# Patient Record
Sex: Female | Born: 1995 | Race: White | Hispanic: No | Marital: Single | Smoking: Never smoker
Health system: Southern US, Community
[De-identification: ages and names within clinical notes are randomized; demographics above are authoritative.]

## PROBLEM LIST (undated history)

## (undated) DIAGNOSIS — F909 Attention-deficit hyperactivity disorder, unspecified type: Secondary | ICD-10-CM

## (undated) DIAGNOSIS — N390 Urinary tract infection, site not specified: Secondary | ICD-10-CM

## (undated) HISTORY — DX: Urinary tract infection, site not specified: N39.0

## (undated) HISTORY — DX: Attention-deficit hyperactivity disorder, unspecified type: F90.9

---
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
## 2014-04-29 ENCOUNTER — Ambulatory Visit: Payer: Self-pay | Admitting: Physician Assistant

## 2014-04-29 LAB — URINALYSIS, COMPLETE
BILIRUBIN, UR: NEGATIVE
Blood: NEGATIVE
Glucose,UR: NEGATIVE
LEUKOCYTE ESTERASE: NEGATIVE
Nitrite: NEGATIVE
Ph: 8.5 (ref 5.0–8.0)
Protein: 100
Specific Gravity: 1.015 (ref 1.000–1.030)

## 2014-04-29 LAB — CBC WITH DIFFERENTIAL/PLATELET
Basophil #: 0 10*3/uL (ref 0.0–0.1)
Basophil %: 0.1 %
EOS ABS: 0.1 10*3/uL (ref 0.0–0.7)
Eosinophil %: 0.7 %
HCT: 39.8 % (ref 35.0–47.0)
HGB: 13.1 g/dL (ref 12.0–16.0)
LYMPHS PCT: 10.1 %
Lymphocyte #: 1.2 10*3/uL (ref 1.0–3.6)
MCH: 29 pg (ref 26.0–34.0)
MCHC: 32.9 g/dL (ref 32.0–36.0)
MCV: 88 fL (ref 80–100)
MONO ABS: 0.7 x10 3/mm (ref 0.2–0.9)
Monocyte %: 5.7 %
Neutrophil #: 9.7 10*3/uL — ABNORMAL HIGH (ref 1.4–6.5)
Neutrophil %: 83.4 %
Platelet: 248 10*3/uL (ref 150–440)
RBC: 4.52 10*6/uL (ref 3.80–5.20)
RDW: 12.4 % (ref 11.5–14.5)
WBC: 11.7 10*3/uL — ABNORMAL HIGH (ref 3.6–11.0)

## 2014-04-29 LAB — CLOSTRIDIUM DIFFICILE(ARMC)

## 2014-04-29 LAB — COMPREHENSIVE METABOLIC PANEL
ALK PHOS: 70 U/L
Albumin: 4.6 g/dL (ref 3.8–5.6)
Anion Gap: 15 (ref 7–16)
BILIRUBIN TOTAL: 1.2 mg/dL — AB (ref 0.2–1.0)
BUN: 9 mg/dL (ref 7–18)
CO2: 24 mmol/L (ref 21–32)
CREATININE: 0.9 mg/dL (ref 0.60–1.30)
Calcium, Total: 9.7 mg/dL (ref 9.0–10.7)
Chloride: 96 mmol/L — ABNORMAL LOW (ref 98–107)
EGFR (African American): >60
Glucose: 116 mg/dL — ABNORMAL HIGH (ref 65–99)
OSMOLALITY: 270 (ref 275–301)
Potassium: 3.3 mmol/L — ABNORMAL LOW (ref 3.5–5.1)
SGOT(AST): 29 U/L — ABNORMAL HIGH (ref 0–26)
SGPT (ALT): 38 U/L
SODIUM: 135 mmol/L — AB (ref 136–145)
Total Protein: 7.6 g/dL (ref 6.4–8.6)

## 2014-05-02 LAB — STOOL CULTURE

## 2014-06-04 ENCOUNTER — Ambulatory Visit (INDEPENDENT_AMBULATORY_CARE_PROVIDER_SITE_OTHER): Payer: BLUE CROSS/BLUE SHIELD | Admitting: Internal Medicine

## 2014-06-04 ENCOUNTER — Encounter (INDEPENDENT_AMBULATORY_CARE_PROVIDER_SITE_OTHER): Payer: Self-pay

## 2014-06-04 ENCOUNTER — Encounter: Payer: Self-pay | Admitting: Internal Medicine

## 2014-06-04 VITALS — BP 100/60 | HR 89 | Temp 98.2°F | Resp 14 | Ht 66.0 in | Wt <= 1120 oz

## 2014-06-04 DIAGNOSIS — Z30011 Encounter for initial prescription of contraceptive pills: Secondary | ICD-10-CM

## 2014-06-04 DIAGNOSIS — F900 Attention-deficit hyperactivity disorder, predominantly inattentive type: Secondary | ICD-10-CM

## 2014-06-04 DIAGNOSIS — Z111 Encounter for screening for respiratory tuberculosis: Secondary | ICD-10-CM

## 2014-06-04 DIAGNOSIS — Z87898 Personal history of other specified conditions: Secondary | ICD-10-CM

## 2014-06-04 DIAGNOSIS — F988 Other specified behavioral and emotional disorders with onset usually occurring in childhood and adolescence: Secondary | ICD-10-CM

## 2014-06-04 LAB — POCT URINE PREGNANCY: PREG TEST UR: NEGATIVE

## 2014-06-04 MED ORDER — NORETHINDRONE ACET-ETHINYL EST 1-20 MG-MCG PO TABS: 1.0000 | ORAL_TABLET | Freq: Every day | ORAL | Status: AC

## 2014-06-04 NOTE — Progress Notes (Signed)
Patient ID: Gloria Jensen, female   DOB: 07/30/1995, 19 y.o.   MRN: [id number]  Patient Active Problem List   Diagnosis Date Noted  . Attention deficit disorder predominant inattentive type 06/06/2014  . Contraceptive management 06/06/2014    Subjective:  CC:   Chief Complaint  Patient presents with  . Establish Care    HPI:   Gloria Jensen a 19 y.o. female who presents for establishment of care.  New patient.  History of ADD  Diagnosed by Dr. Maryruth BunKapur after referral by her pediatrician last summer.  Her pediatrician has been prescribing medication  She is Studying nursing at [employer] .  Just started the [employer] program.  as a high school student did cheerleading  And competetive dance,  Some broken toes ,  Nothing major.     Spends 4 hours in class daily,  Has about 2 hours per night of homework .  Workload varies.  Not having any trouble finishing tasks since she started using stimulants.  Diagnosed with ADD last year after graduation from [employer],  Had difficulty in school but was not checked out until seen by Dr Maryruth BunKapur.Marland Kitchen.  Has been taking it for a year and performance is improving.  Sleepimg well. Weight stable .  Has a steady, Boyfriend,  Not on birth control, but using barrier protection with condoms. . Wants to resume oral contraception.   Receved Gardasil from her pediatrician  Prior to onset of sexual activity.   History of an episode of  right sided CVA pain accompanied by nausea and vomiting in August 2015 .  [facility] Tre[facility] at [facility] . Evaluation included CT scan which was negative for stones appendix not visutalized,  CBC UA and CMET were normal.  Has recurrence of right sided pain from time to time, never as severe as prior episode.  More recent ER evaluation for diarrhea,  All cultures were negative.      Past Medical History  Diagnosis Date  . UTI (lower urinary tract infection)     couple in the past  . ADHD (attention deficit hyperactivity disorder) 2015    Allergies  Allergen Reactions  . Penicillins Rash    History reviewed. No pertinent past surgical history.  History   Social History  . Marital Status: Single    Spouse Name: N/A  . Number of Children: N/A  . Years of Education: N/A   Occupational History  . Not on file.   Social History Main Topics  . Smoking status: Never Smoker   . Smokeless tobacco: Never Used  . Alcohol Use: Yes     Comment: some occasional  . Drug Use: No  . Sexual Activity: Yes   Other Topics Concern  . Not on file   Social History Narrative  . No narrative on file   Family History  Problem Relation Age of Onset  . Hypertension Mother   . Diabetes Maternal Grandmother      Review of Systems: Patient denies headache, fevers, malaise, unintentional weight loss, skin rash, eye pain, sinus congestion and sinus pain, sore throat, dysphagia,  hemoptysis , cough, dyspnea, wheezing, chest pain, palpitations, orthopnea, edema, abdominal pain, nausea, melena, diarrhea, constipation, flank pain, dysuria, hematuria, urinary  Frequency, nocturia, numbness, tingling, seizures,  Focal weakness, Loss of consciousness,  Tremor, insomnia, depression, anxiety, and suicidal ideation.     Objective:  BP 100/60 mmHg  Pulse 89  Temp(Src) 98.2 F (36.8 C) (Oral)  Resp 14  Ht 5\' 6"  (1.676 m)  Wt 12 lb (5.443 kg)  BMI 1.94 kg/m2  SpO2 100%  LMP 05/10/2014 (Approximate)  General appearance: alert, cooperative and appears stated age Ears: normal TM's and external ear canals both ears Throat: lips, mucosa, and tongue normal; teeth and gums normal Neck: no adenopathy, no carotid bruit, supple, symmetrical, trachea midline and thyroid not enlarged, symmetric, no tenderness/mass/nodules Back: symmetric, no curvature. ROM normal. No CVA tenderness. Lungs: clear to auscultation bilaterally Heart: regular rate and rhythm, S1, S2 normal, no murmur, click, rub or gallop Abdomen: soft, non-tender; bowel sounds  normal; no masses,  no organomegaly Pulses: 2+ and symmetric Skin: Skin color, texture, turgor normal. No rashes or lesions Lymph nodes: Cervical, supraclavicular, and axillary nodes normal.  Assessment and Plan:  Attention deficit disorder predominant inattentive type Diagnosed by Dr. Maryruth Bun,  Treatment with adderall XR and adderall has been stable and will be continued per regimen.  Contract will be signed to controlled substances. The risks and benefits of stimulant  use were discussed with patient today including excessive agitation ,  Hypertension, tachycardia and  addiction.  Patient was advised to avoid concurrent use with alcohol, to use medication only as needed and not to share with others  .     Contraceptive management urine pregnancy test was negative. Patient screened for history of domestic violence,  DVTs, tobacco abuse.  Use of pill described and need for barrier protection to prevent STDS, and for prevention of contraception  during periods of concurrent use of antibiotics     Updated Medication List Outpatient Encounter Prescriptions as of 06/04/2014  Medication Sig  . amphetamine-dextroamphetamine (ADDERALL XR) 20 MG 24 hr capsule Take 2 capsules by mouth daily.  Marland Kitchen amphetamine-dextroamphetamine (ADDERALL) 10 MG tablet Take 1 tablet by mouth daily. Take daily at 5 PM  . norethindrone-ethinyl estradiol (MICROGESTIN,JUNEL,LOESTRIN) 1-20 MG-MCG tablet Take 1 tablet by mouth daily.     Orders Placed This Encounter  Procedures  . Comprehensive metabolic panel  . Urinalysis, Routine w reflex microscopic  . POCT urine pregnancy  . TB Skin Test    No Follow-up on file.

## 2014-06-04 NOTE — Patient Instructions (Signed)
Ethinyl Estradiol; Norethindrone Acetate tablets (contraception) What is this medicine? ETHINYL ESTRADIOL; NORETHINDRONE ACETATE (ETH in il es tra DYE ole; nor eth IN drone AS e tate) is an oral contraceptive. The products combine two types of female hormones, an estrogen and a progestin. They are used to prevent ovulation and pregnancy. This medicine may be used for other purposes; ask your health care provider or pharmacist if you have questions. COMMON BRAND NAME(S): Gildess, Junel 1.5/30, Junel 1/20, LARIN, Loestrin 1.5/30, Loestrin 1/20, Microgestin 1.5/30, Microgestin 1/20 What should I tell my health care provider before I take this medicine? They need to know if you have or ever had any of these conditions: -abnormal vaginal bleeding -blood vessel disease or blood clots -breast, cervical, endometrial, ovarian, liver, or uterine cancer -diabetes -gallbladder disease -heart disease or recent heart attack -high blood pressure -high cholesterol -kidney disease -liver disease -migraine headaches -stroke -systemic lupus erythematosus (SLE) -tobacco smoker -an unusual or allergic reaction to estrogens, progestins, other medicines, foods, dyes, or preservatives -pregnant or trying to get pregnant -breast-feeding How should I use this medicine? Take this medicine by mouth. To reduce nausea, this medicine may be taken with food. Follow the directions on the prescription label. Take this medicine at the same time each day and in the order directed on the package. Do not take your medicine more often than directed. Contact your pediatrician regarding the use of this medicine in children. Special care may be needed. This medicine has been used in female children who have started having menstrual periods. A patient package insert for the product will be given with each prescription and refill. Read this sheet carefully each time. The sheet may change frequently. Overdosage: If you think you  have taken too much of this medicine contact a poison control center or emergency room at once. NOTE: This medicine is only for you. Do not share this medicine with others. What if I miss a dose? If you miss a dose, refer to the patient information sheet you received with your medicine for direction. If you miss more than one pill, this medicine may not be as effective and you may need to use another form of birth control. What may interact with this medicine? -acetaminophen -antibiotics or medicines for infections, especially rifampin, rifabutin, rifapentine, and griseofulvin, and possibly penicillins or tetracyclines -aprepitant -ascorbic acid (vitamin C) -atorvastatin -barbiturate medicines, such as phenobarbital -bosentan -carbamazepine -caffeine -clofibrate -cyclosporine -dantrolene -doxercalciferol -felbamate -grapefruit juice -hydrocortisone -medicines for anxiety or sleeping problems, such as diazepam or temazepam -medicines for diabetes, including pioglitazone -mineral oil -modafinil -mycophenolate -nefazodone -oxcarbazepine -phenytoin -prednisolone -ritonavir or other medicines for HIV infection or AIDS -rosuvastatin -selegiline -soy isoflavones supplements -St. John's wort -tamoxifen or raloxifene -theophylline -thyroid hormones -topiramate -warfarin This list may not describe all possible interactions. Give your health care provider a list of all the medicines, herbs, non-prescription drugs, or dietary supplements you use. Also tell them if you smoke, drink alcohol, or use illegal drugs. Some items may interact with your medicine. What should I watch for while using this medicine? Visit your doctor or health care professional for regular checks on your progress. You will need a regular breast and pelvic exam and Pap smear while on this medicine. Use an additional method of contraception during the first cycle that you take these tablets. If you have any reason  to think you are pregnant, stop taking this medicine right away and contact your doctor or health care professional. If you are taking this   medicine for hormone related problems, it may take several cycles of use to see improvement in your condition. Smoking increases the risk of getting a blood clot or having a stroke while you are taking birth control pills, especially if you are more than 20 years old. You are strongly advised not to smoke. This medicine can make your body retain fluid, making your fingers, hands, or ankles swell. Your blood pressure can go up. Contact your doctor or health care professional if you feel you are retaining fluid. This medicine can make you more sensitive to the sun. Keep out of the sun. If you cannot avoid being in the sun, wear protective clothing and use sunscreen. Do not use sun lamps or tanning beds/booths. If you wear contact lenses and notice visual changes, or if the lenses begin to feel uncomfortable, consult your eye care specialist. In some women, tenderness, swelling, or minor bleeding of the gums may occur. Notify your dentist if this happens. Brushing and flossing your teeth regularly may help limit this. See your dentist regularly and inform your dentist of the medicines you are taking. If you are going to have elective surgery, you may need to stop taking this medicine before the surgery. Consult your health care professional for advice. This medicine does not protect you against HIV infection (AIDS) or any other sexually transmitted diseases. What side effects may I notice from receiving this medicine? Side effects that you should report to your doctor or health care professional as soon as possible: -breast tissue changes or discharge -changes in vaginal bleeding during your period or between your periods -chest pain -coughing up blood -dizziness or fainting spells -headaches or migraines -leg, arm or groin pain -severe or sudden  headaches -stomach pain (severe) -sudden shortness of breath -sudden loss of coordination, especially on one side of the body -speech problems -symptoms of vaginal infection like itching, irritation or unusual discharge -tenderness in the upper abdomen -vomiting -weakness or numbness in the arms or legs, especially on one side of the body -yellowing of the eyes or skin Side effects that usually do not require medical attention (report to your doctor or health care professional if they continue or are bothersome): -breakthrough bleeding and spotting that continues beyond the 3 initial cycles of pills -breast tenderness -mood changes, anxiety, depression, frustration, anger, or emotional outbursts -increased sensitivity to sun or ultraviolet light -nausea -skin rash, acne, or Rawdon spots on the skin -weight gain (slight) This list may not describe all possible side effects. Call your doctor for medical advice about side effects. You may report side effects to FDA at 1-800-FDA-1088. Where should I keep my medicine? Keep out of the reach of children. Store at room temperature between 15 and 30 degrees C (59 and 86 degrees F). Throw away any unused medicine after the expiration date. NOTE: This sheet is a summary. It may not cover all possible information. If you have questions about this medicine, talk to your doctor, pharmacist, or health care provider.  2015, Elsevier/Gold Standard. (2012-08-18 15:35:20)  

## 2014-06-05 LAB — COMPREHENSIVE METABOLIC PANEL
ALBUMIN: 5 g/dL (ref 3.5–5.2)
ALK PHOS: 52 U/L (ref 47–119)
ALT: 10 U/L (ref 0–35)
AST: 14 U/L (ref 0–37)
BUN: 7 mg/dL (ref 6–23)
CALCIUM: 10.1 mg/dL (ref 8.4–10.5)
CO2: 26 mEq/L (ref 19–32)
CREATININE: 0.68 mg/dL (ref 0.40–1.20)
Chloride: 106 mEq/L (ref 96–112)
GFR: 117.41 mL/min (ref >60.00)
Glucose, Bld: 92 mg/dL (ref 70–99)
POTASSIUM: 3.7 meq/L (ref 3.5–5.1)
Sodium: 138 mEq/L (ref 135–145)
Total Bilirubin: 0.7 mg/dL (ref 0.2–1.2)
Total Protein: 7.3 g/dL (ref 6.0–8.3)

## 2014-06-05 LAB — URINALYSIS, ROUTINE W REFLEX MICROSCOPIC
Bilirubin Urine: NEGATIVE
HGB URINE DIPSTICK: NEGATIVE
KETONES UR: NEGATIVE
LEUKOCYTES UA: NEGATIVE
Nitrite: NEGATIVE
RBC / HPF: NONE SEEN (ref 0–?)
SPECIFIC GRAVITY, URINE: 1.01 (ref 1.000–1.030)
Total Protein, Urine: NEGATIVE
Urine Glucose: NEGATIVE
Urobilinogen, UA: 0.2 (ref 0.0–1.0)
pH: 7.5 (ref 5.0–8.0)

## 2014-06-06 ENCOUNTER — Ambulatory Visit (INDEPENDENT_AMBULATORY_CARE_PROVIDER_SITE_OTHER): Payer: BLUE CROSS/BLUE SHIELD | Admitting: *Deleted

## 2014-06-06 ENCOUNTER — Encounter: Payer: Self-pay | Admitting: Internal Medicine

## 2014-06-06 DIAGNOSIS — Z23 Encounter for immunization: Secondary | ICD-10-CM

## 2014-06-06 DIAGNOSIS — Z309 Encounter for contraceptive management, unspecified: Secondary | ICD-10-CM | POA: Insufficient documentation

## 2014-06-06 DIAGNOSIS — F988 Other specified behavioral and emotional disorders with onset usually occurring in childhood and adolescence: Secondary | ICD-10-CM | POA: Insufficient documentation

## 2014-06-06 LAB — TB SKIN TEST
Induration: 0 mm
TB SKIN TEST: NEGATIVE

## 2014-06-06 NOTE — Assessment & Plan Note (Signed)
Diagnosed by Dr. Maryruth BunKapur,  Treatment with adderall XR and adderall has been stable and will be continued per regimen.  Contract will be signed to controlled substances. The risks and benefits of stimulant  use were discussed with patient today including excessive agitation ,  Hypertension, tachycardia and  addiction.  Patient was advised to avoid concurrent use with alcohol, to use medication only as needed and not to share with others  .

## 2014-06-06 NOTE — Assessment & Plan Note (Signed)
urine pregnancy test was negative. Patient screened for history of DVTs, tobacco abuse.  Use of pill described and need for barrier protection to prevent STDS, and for prevention of contraception  during periods of concurrent use of antibiotics

## 2014-11-25 ENCOUNTER — Telehealth: Payer: Self-pay | Admitting: Internal Medicine

## 2014-11-25 DIAGNOSIS — R1011 Right upper quadrant pain: Secondary | ICD-10-CM

## 2014-11-25 NOTE — Telephone Encounter (Signed)
Patient Name: Gloria Jensen DOB: 03-29-1996 Initial Comment Caller states wants appt for 19 yr old Dtr, having back pain that begins in back and goes around to front; history of same, but gets better at times. was so bad made her vomit Saturday; **mom is at work @ hospital Nurse Assessment Nurse: Elijah Birk, RN, [place] Bray Date/Time (Eastern Time): 11/25/2014 11:14:55 AM Confirm and document reason for call. If symptomatic, describe symptoms. ---Caller states she wants appt. for 19 yr old dtr. She having lower back pain that begins in back and goes around to front. Has a history of same, but gets better at times. It was so bad made her vomit Saturday. No urinary symptoms, no fever. Has the patient traveled out of the country within the last 30 days? ---Not Applicable Does the patient require triage? ---Yes Related visit to physician within the last 2 weeks? ---No Does the PT have any chronic conditions? (i.e. diabetes, asthma, etc.) ---No Did the patient indicate they were pregnant? ---No Guidelines Guideline Title Affirmed Question Affirmed Notes Back Pain Cause of back pain is uncertain (no history of overuse or twisting) (Exception: transient pains) Final Disposition User See PCP When Office is Open (within 3 days) Elijah Birk, Charity fundraiser, [employer] states her dtr states it is not from exercise or straining. She has the back pain all the time, but it worsened over the weekend. Disagree/Comply: Comply

## 2014-11-25 NOTE — Telephone Encounter (Signed)
Patient scheduled to see Dr. Dan Humphreys 11/26/14

## 2014-11-26 ENCOUNTER — Ambulatory Visit (INDEPENDENT_AMBULATORY_CARE_PROVIDER_SITE_OTHER): Payer: BLUE CROSS/BLUE SHIELD | Admitting: Internal Medicine

## 2014-11-26 ENCOUNTER — Encounter (INDEPENDENT_AMBULATORY_CARE_PROVIDER_SITE_OTHER): Payer: Self-pay

## 2014-11-26 ENCOUNTER — Encounter: Payer: Self-pay | Admitting: Internal Medicine

## 2014-11-26 VITALS — BP 118/68 | HR 107 | Temp 98.2°F | Wt 120.2 lb

## 2014-11-26 DIAGNOSIS — R1011 Right upper quadrant pain: Secondary | ICD-10-CM | POA: Diagnosis not present

## 2014-11-26 LAB — POCT URINALYSIS DIPSTICK
BILIRUBIN UA: NEGATIVE
Blood, UA: NEGATIVE
Glucose, UA: NEGATIVE
Ketones, UA: NEGATIVE
Leukocytes, UA: NEGATIVE
Nitrite, UA: NEGATIVE
PROTEIN UA: NEGATIVE
Spec Grav, UA: 1.015
Urobilinogen, UA: 0.2
pH, UA: 7.5

## 2014-11-26 NOTE — Progress Notes (Signed)
Pre visit review using our clinic review tool, if applicable. No additional management support is needed unless otherwise documented below in the visit note. 

## 2014-11-26 NOTE — Progress Notes (Signed)
   Subjective:    Patient ID: Gloria Jensen, female    DOB: 12/24/1995, 19 y.o.   MRN: [id number]  HPI  19YO female presents for follow up.  Back Pain - Right sided back pain, severe at times. Radiates to front. Causes some vomiting when severe. Going on for months to a year. Had CT scan in the past which was normal. No change in pain with movement. No change with certain foods. No urinary symptoms. No diarrhea. No change in appetite. No fever, chills.  PAST MEDICAL HISTORY/SURGICAL HISTORY: No previous abdominal surgeries.  Past medical, surgical, family and social history per today's encounter.  Review of Systems  Constitutional: Negative for fever, chills, appetite change, fatigue and unexpected weight change.  Eyes: Negative for visual disturbance.  Respiratory: Negative for shortness of breath.   Cardiovascular: Negative for chest pain and leg swelling.  Gastrointestinal: Positive for nausea, vomiting and abdominal pain. Negative for diarrhea and constipation.  Musculoskeletal: Positive for back pain. Negative for myalgias and arthralgias.  Skin: Negative for color change and rash.  Hematological: Negative for adenopathy. Does not bruise/bleed easily.  Psychiatric/Behavioral: Negative for dysphoric mood. The patient is not nervous/anxious.        Objective:    BP 118/68 mmHg  Pulse 107  Temp(Src) 98.2 F (36.8 C) (Oral)  Wt 120 lb 3.2 oz (54.522 kg)  SpO2 99%  LMP 10/28/2014 Physical Exam  Constitutional: She is oriented to person, place, and time. She appears well-developed and well-nourished. No distress.  HENT:  Head: Normocephalic and atraumatic.  Right Ear: External ear normal.  Left Ear: External ear normal.  Nose: Nose normal.  Mouth/Throat: Oropharynx is clear and moist. No oropharyngeal exudate.  Eyes: Conjunctivae and EOM are normal. Pupils are equal, round, and reactive to light. Right eye exhibits no discharge.  Neck: Normal range of  motion. Neck supple. No thyromegaly present.  Cardiovascular: Normal rate, regular rhythm, normal heart sounds and intact distal pulses.  Exam reveals no gallop and no friction rub.   No murmur heard. Pulmonary/Chest: Effort normal. No respiratory distress. She has no wheezes. She has no rales.  Abdominal: Soft. Bowel sounds are normal. She exhibits no distension and no mass. There is no hepatosplenomegaly. There is tenderness in the right upper quadrant. There is no rebound, no guarding and no CVA tenderness.  Musculoskeletal: Normal range of motion. She exhibits no edema or tenderness.  Lymphadenopathy:    She has no cervical adenopathy.  Neurological: She is alert and oriented to person, place, and time. No cranial nerve deficit. Coordination normal.  Skin: Skin is warm and dry. No rash noted. She is not diaphoretic. No erythema. No pallor.  Psychiatric: She has a normal mood and affect. Her behavior is normal. Judgment and thought content normal.          Assessment & Plan:   Problem List Items Addressed This Visit      Unprioritized   Abdominal pain, right upper quadrant - Primary    Intermittent RUQ abdominal pain. Symptoms are concerning for cholecystitis. Will check CBC, CMP, lipase, urinalysis. Will get [place] of RUQ. Will request previous CT from [facility]. Follow up in 1-2 weeks.      Relevant Orders   CBC with Differential/Platelet   Comprehensive metabolic panel   POCT Urinalysis Dipstick   Lipase   US Abdomen Limited RUQ       Return in about 1 week (around 12/03/2014) for Recheck.

## 2014-11-26 NOTE — Assessment & Plan Note (Signed)
Intermittent RUQ abdominal pain. Symptoms are concerning for cholecystitis. Will check CBC, CMP, lipase, urinalysis. Will get [place] of RUQ. Will request previous CT from [facility]. Follow up in 1-2 weeks.

## 2014-11-26 NOTE — Patient Instructions (Signed)
Labs today.  We will set up ultrasound of your liver and gallbladder.  Follow up next week.

## 2014-11-27 LAB — CBC WITH DIFFERENTIAL/PLATELET
BASOS PCT: 0.7 % (ref 0.0–3.0)
Basophils Absolute: 0 10*3/uL (ref 0.0–0.1)
EOS PCT: 0.6 % (ref 0.0–5.0)
Eosinophils Absolute: 0 10*3/uL (ref 0.0–0.7)
HEMATOCRIT: 36.5 % (ref 36.0–46.0)
HEMOGLOBIN: 12.3 g/dL (ref 12.0–15.0)
LYMPHS ABS: 1.6 10*3/uL (ref 0.7–4.0)
Lymphocytes Relative: 26.4 % (ref 12.0–46.0)
MCHC: 33.6 g/dL (ref 30.0–36.0)
MCV: 87.3 fl (ref 78.0–100.0)
MONO ABS: 0.4 10*3/uL (ref 0.1–1.0)
Monocytes Relative: 6.4 % (ref 3.0–12.0)
NEUTROS PCT: 65.9 % (ref 43.0–77.0)
Neutro Abs: 4.1 10*3/uL (ref 1.4–7.7)
Platelets: 238 10*3/uL (ref 150.0–400.0)
RBC: 4.19 Mil/uL (ref 3.87–5.11)
RDW: 12.6 % (ref 11.5–14.6)
WBC: 6.2 10*3/uL (ref 4.5–10.5)

## 2014-11-27 LAB — COMPREHENSIVE METABOLIC PANEL
ALT: 15 U/L (ref 0–35)
AST: 12 U/L (ref 0–37)
Albumin: 4.7 g/dL (ref 3.5–5.2)
Alkaline Phosphatase: 50 U/L (ref 39–117)
BUN: 7 mg/dL (ref 6–23)
CALCIUM: 9.6 mg/dL (ref 8.4–10.5)
CO2: 27 mEq/L (ref 19–32)
Chloride: 105 mEq/L (ref 96–112)
Creatinine, Ser: 0.65 mg/dL (ref 0.40–1.20)
GFR: 123.09 mL/min (ref >60.00)
GLUCOSE: 95 mg/dL (ref 70–99)
Potassium: 3.8 mEq/L (ref 3.5–5.1)
Sodium: 140 mEq/L (ref 135–145)
Total Bilirubin: 0.5 mg/dL (ref 0.2–1.2)
Total Protein: 6.7 g/dL (ref 6.0–8.3)

## 2014-11-27 LAB — LIPASE: Lipase: 16 U/L (ref 11.0–59.0)

## 2014-11-28 ENCOUNTER — Ambulatory Visit
Admission: RE | Admit: 2014-11-28 | Discharge: 2014-11-28 | Disposition: A | Discharge status: Home / Self Care | Payer: BLUE CROSS/BLUE SHIELD | Source: Ambulatory Visit | Attending: Internal Medicine | Admitting: Internal Medicine

## 2014-11-28 DIAGNOSIS — R1011 Right upper quadrant pain: Secondary | ICD-10-CM | POA: Diagnosis not present

## 2014-11-29 ENCOUNTER — Other Ambulatory Visit: Payer: Self-pay | Admitting: Internal Medicine

## 2014-11-29 DIAGNOSIS — R1011 Right upper quadrant pain: Secondary | ICD-10-CM

## 2014-12-04 ENCOUNTER — Encounter: Payer: Self-pay | Admitting: Internal Medicine

## 2014-12-04 ENCOUNTER — Ambulatory Visit (INDEPENDENT_AMBULATORY_CARE_PROVIDER_SITE_OTHER): Payer: BLUE CROSS/BLUE SHIELD | Admitting: Internal Medicine

## 2014-12-04 VITALS — BP 110/62 | HR 77 | Temp 98.5°F | Wt 121.0 lb

## 2014-12-04 DIAGNOSIS — R1011 Right upper quadrant pain: Secondary | ICD-10-CM | POA: Diagnosis not present

## 2014-12-04 NOTE — Assessment & Plan Note (Signed)
Reviewed recent US of the abdomen which was normal. Reviewed labs which were normal. Will schedule HIDA scan for Friday with follow up after scan. Discussed potential causes of pain including gallbladder dysfunction, nephrolithiasis, muscular pain. Follow up in 2 days.

## 2014-12-04 NOTE — Patient Instructions (Signed)
HIDA on Friday.  Follow up Friday.

## 2014-12-04 NOTE — Progress Notes (Signed)
Subjective:    Patient ID: Gloria Jensen, female    DOB: Jun 05, 1995, 19 y.o.   MRN: [id number]  HPI  19YO female presents for follow up. Recently seen for right upper abdominal pain. Labs and [place] were normal.  On Saturday, had recurrent episode of right upper abdominal/back pain. No symptoms of nausea or vomiting with this episode. No recent injury. Pain described as sharp pain that "comes and goes." Resolved without intervention. No urinary symptoms. Not taking any medication for pain.    Past Medical History  Diagnosis Date  . UTI (lower urinary tract infection)     couple in the past  . ADHD (attention deficit hyperactivity disorder) 2015   Family History  Problem Relation Age of Onset  . Hypertension Mother   . Diabetes Maternal Grandmother    Social History   Social History  . Marital Status: Single    Spouse Name: N/A  . Number of Children: N/A  . Years of Education: N/A   Occupational History  . Not on file.   Social History Main Topics  . Smoking status: Never Smoker   . Smokeless tobacco: Never Used  . Alcohol Use: Yes     Comment: some occasional  . Drug Use: No  . Sexual Activity: Yes   Other Topics Concern  . Not on file   Social History Narrative    No past surgical history on file.  Review of Systems  Constitutional: Negative for fever, chills, appetite change, fatigue and unexpected weight change.  Eyes: Negative for visual disturbance.  Respiratory: Negative for shortness of breath.   Cardiovascular: Negative for chest pain and leg swelling.  Gastrointestinal: Positive for abdominal pain. Negative for nausea, vomiting, diarrhea, constipation and blood in stool.  Genitourinary: Negative for urgency, frequency, hematuria, flank pain, vaginal pain and pelvic pain.  Musculoskeletal: Positive for myalgias, back pain and arthralgias.  Skin: Negative for color change and rash.  Hematological: Negative for adenopathy. Does not  bruise/bleed easily.  Psychiatric/Behavioral: Negative for sleep disturbance and dysphoric mood. The patient is not nervous/anxious.        Objective:    BP 110/62 mmHg  Pulse 77  Temp(Src) 98.5 F (36.9 C) (Oral)  Wt 121 lb (54.885 kg)  SpO2 99%  LMP 10/28/2014 Physical Exam  Constitutional: She is oriented to person, place, and time. She appears well-developed and well-nourished. No distress.  HENT:  Head: Normocephalic and atraumatic.  Right Ear: External ear normal.  Left Ear: External ear normal.  Nose: Nose normal.  Mouth/Throat: Oropharynx is clear and moist.  Eyes: Conjunctivae and EOM are normal. Pupils are equal, round, and reactive to light. Right eye exhibits no discharge.  Neck: Normal range of motion. Neck supple. No thyromegaly present.  Cardiovascular: Normal rate, regular rhythm, normal heart sounds and intact distal pulses.  Exam reveals no gallop and no friction rub.   No murmur heard. Pulmonary/Chest: Effort normal. No respiratory distress. She has no wheezes. She has no rales.  Abdominal: Soft. Bowel sounds are normal. She exhibits no distension and no mass. There is no tenderness. There is no rebound and no guarding.  Musculoskeletal: Normal range of motion. She exhibits no edema or tenderness.       Thoracic back: She exhibits no tenderness, no edema and no pain.       Back:  Lymphadenopathy:    She has no cervical adenopathy.  Neurological: She is alert and oriented to person, place, and time. No cranial nerve deficit.  Coordination normal.  Skin: Skin is warm and dry. No rash noted. She is not diaphoretic. No erythema. No pallor.  Psychiatric: She has a normal mood and affect. Her behavior is normal. Judgment and thought content normal.          Assessment & Plan:  Over of which >50% spent in face-to-face contact with patient discussing plan of care  Problem List Items Addressed This Visit      Unprioritized   Abdominal pain, right upper  quadrant - Primary    Reviewed recent US of the abdomen which was normal. Reviewed labs which were normal. Will schedule HIDA scan for Friday with follow up after scan. Discussed potential causes of pain including gallbladder dysfunction, nephrolithiasis, muscular pain. Follow up in 2 days.          Return in about 2 days (around 12/06/2014) for Recheck.

## 2014-12-06 ENCOUNTER — Ambulatory Visit (INDEPENDENT_AMBULATORY_CARE_PROVIDER_SITE_OTHER): Payer: BLUE CROSS/BLUE SHIELD | Admitting: Internal Medicine

## 2014-12-06 ENCOUNTER — Encounter
Admission: RE | Admit: 2014-12-06 | Discharge: 2014-12-06 | Disposition: A | Discharge status: Home / Self Care | Payer: BLUE CROSS/BLUE SHIELD | Source: Ambulatory Visit | Attending: Internal Medicine | Admitting: Internal Medicine

## 2014-12-06 ENCOUNTER — Encounter: Payer: Self-pay | Admitting: Internal Medicine

## 2014-12-06 VITALS — BP 118/60 | HR 75 | Temp 98.2°F | Resp 12 | Ht 66.0 in | Wt 121.2 lb

## 2014-12-06 DIAGNOSIS — R1011 Right upper quadrant pain: Secondary | ICD-10-CM

## 2014-12-06 MED ORDER — TECHNETIUM TC 99M MEBROFENIN IV KIT
5.4500 | PACK | Freq: Once | INTRAVENOUS | Status: DC | PRN
  Administered 2014-12-06: 5.456 via INTRAVENOUS
  Filled 2014-12-06: qty 6

## 2014-12-06 MED ORDER — SINCALIDE 5 MCG IJ SOLR
0.0200 ug/kg | Freq: Once | INTRAMUSCULAR | Status: AC
  Administered 2014-12-06: 1.1 ug via INTRAVENOUS
  Filled 2014-12-06: qty 5

## 2014-12-06 NOTE — Progress Notes (Signed)
Pre-visit discussion using our clinic review tool. No additional management support is needed unless otherwise documented below in the visit note.  

## 2014-12-06 NOTE — Patient Instructions (Signed)
We will set up a CT scan of the abdomen for further evaluation.

## 2014-12-06 NOTE — Progress Notes (Signed)
Subjective:    Patient ID: Gloria Jensen, female    DOB: Nov 13, 1995, 19 y.o.   MRN: [id number]  HPI 19YO female presents to follow up right abdominal/flank pain.  HIDA scan performed today was normal. Recent RUQ [place] was normal.  No current abdominal pain. No nausea. Last episode was last weekend, and was more mild than previous episodes.   Past Medical History  Diagnosis Date  . UTI (lower urinary tract infection)     couple in the past  . ADHD (attention deficit hyperactivity disorder) 2015   Family History  Problem Relation Age of Onset  . Hypertension Mother   . Diabetes Maternal Grandmother    No past surgical history on file. Social History   Social History  . Marital Status: Single    Spouse Name: N/A  . Number of Children: N/A  . Years of Education: N/A   Social History Main Topics  . Smoking status: Never Smoker   . Smokeless tobacco: Never Used  . Alcohol Use: Yes     Comment: some occasional  . Drug Use: No  . Sexual Activity: Yes   Other Topics Concern  . None   Social History Narrative    Review of Systems  Constitutional: Negative for fever, chills, appetite change, fatigue and unexpected weight change.  Eyes: Negative for visual disturbance.  Respiratory: Negative for shortness of breath.   Cardiovascular: Negative for chest pain and leg swelling.  Gastrointestinal: Negative for nausea, vomiting, abdominal pain, diarrhea and constipation.  Musculoskeletal: Negative for myalgias and arthralgias.  Skin: Negative for color change and rash.  Hematological: Negative for adenopathy. Does not bruise/bleed easily.  Psychiatric/Behavioral: Negative for sleep disturbance and dysphoric mood. The patient is not nervous/anxious.        Objective:    BP 118/60 mmHg  Pulse 75  Temp(Src) 98.2 F (36.8 C) (Oral)  Resp 12  Ht  (1.676 m)  Wt 121 lb 4 oz (54.999 kg)  BMI 19.58 kg/m2  SpO2 100%  LMP 11/08/2014 (Exact Date) Physical  Exam  Constitutional: She is oriented to person, place, and time. She appears well-developed and well-nourished. No distress.  HENT:  Head: Normocephalic and atraumatic.  Right Ear: External ear normal.  Left Ear: External ear normal.  Nose: Nose normal.  Mouth/Throat: Oropharynx is clear and moist.  Eyes: Conjunctivae and EOM are normal. Pupils are equal, round, and reactive to light. Right eye exhibits no discharge. Left eye exhibits no discharge. No scleral icterus.  Neck: Normal range of motion. Neck supple. No tracheal deviation present. No thyromegaly present.  Cardiovascular: Normal rate, regular rhythm, normal heart sounds and intact distal pulses.  Exam reveals no gallop and no friction rub.   No murmur heard. Pulmonary/Chest: Effort normal and breath sounds normal. No respiratory distress. She has no wheezes. She has no rales. She exhibits no tenderness.  Abdominal: Soft. Bowel sounds are normal. She exhibits no distension and no mass. There is no tenderness. There is no rebound and no guarding.  Musculoskeletal: Normal range of motion. She exhibits no edema or tenderness.  Lymphadenopathy:    She has no cervical adenopathy.  Neurological: She is alert and oriented to person, place, and time. No cranial nerve deficit. She exhibits normal muscle tone. Coordination normal.  Skin: Skin is warm and dry. No rash noted. She is not diaphoretic. No erythema. No pallor.  Psychiatric: She has a normal mood and affect. Her behavior is normal. Judgment and thought content normal.  Assessment & Plan:   Problem List Items Addressed This Visit      Unprioritized   Abdominal pain, right upper quadrant - Primary    Episodic right upper abdominal/flank pain. Exam normal. RUQ [place] was normal. HIDA scan today was normal. Discussed potential additional imaging with CT. She will discuss with her Mother over the weekend. Will plan to get CT next week.      Relevant Orders   CT Abdomen  Pelvis W Contrast       Return in about 2 weeks (around 12/20/2014) for Recheck.

## 2014-12-06 NOTE — Assessment & Plan Note (Signed)
Episodic right upper abdominal/flank pain. Exam normal. RUQ [place] was normal. HIDA scan today was normal. Discussed potential additional imaging with CT. She will discuss with her Mother over the weekend. Will plan to get CT next week.

## 2014-12-11 ENCOUNTER — Other Ambulatory Visit: Payer: Self-pay | Admitting: Internal Medicine

## 2014-12-11 DIAGNOSIS — R1011 Right upper quadrant pain: Secondary | ICD-10-CM

## 2014-12-11 NOTE — Telephone Encounter (Signed)
-----   Message from Mordecai Maes sent at 12/11/2014  1:57 PM EDT ----- I'm sorry Dr. Dan Humphreys! Scheduling said that the new order for [facility] just needs to say CT abd with cm. Your new order says ct abd limited with cm. Thanks, [employer]

## 2014-12-12 ENCOUNTER — Ambulatory Visit
Admission: RE | Admit: 2014-12-12 | Discharge: 2014-12-12 | Disposition: A | Discharge status: Home / Self Care | Payer: BLUE CROSS/BLUE SHIELD | Source: Ambulatory Visit | Attending: Internal Medicine | Admitting: Internal Medicine

## 2014-12-12 DIAGNOSIS — R1011 Right upper quadrant pain: Secondary | ICD-10-CM | POA: Diagnosis not present

## 2014-12-12 MED ORDER — IOHEXOL 300 MG/ML  SOLN
75.0000 mL | Freq: Once | INTRAMUSCULAR | Status: AC | PRN
  Administered 2014-12-12: 75 mL via INTRAVENOUS

## 2015-11-07 ENCOUNTER — Telehealth: Payer: Self-pay | Admitting: Internal Medicine

## 2015-11-07 NOTE — Telephone Encounter (Signed)
Patient needs a copy of her immunizations. She needs it asap.

## 2015-11-07 NOTE — Telephone Encounter (Signed)
Patient DPR notified she will have to contact [facility],

## 2015-11-20 ENCOUNTER — Telehealth: Payer: Self-pay | Admitting: Internal Medicine

## 2015-11-20 NOTE — Telephone Encounter (Signed)
Pt would like to have a copy of her immunization record. Could you please run a copy for her. Pt asks that you call her when ready to pick up. [phone]

## 2015-11-21 IMAGING — US US ABDOMEN LIMITED
1 series · 14 of 25 positions shown · non-contrast
Comparison: Abdominal CT scan December 01, 2013

CLINICAL DATA: Intermittent right upper quadrant abdominal pain for
the past year

EXAM:
US ABDOMEN LIMITED - RIGHT UPPER QUADRANT

[Series 1: us abdomen limited · 0.12mm/px · 14 of 38 slices shown]
[im 1/38]
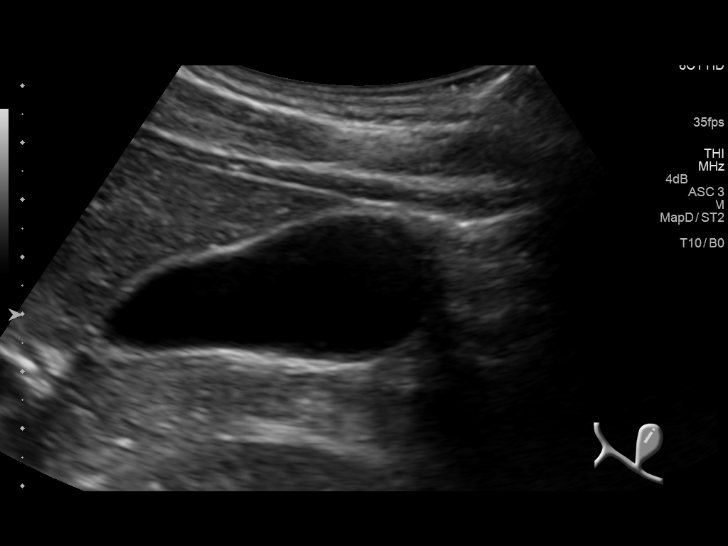
[im 4/38]
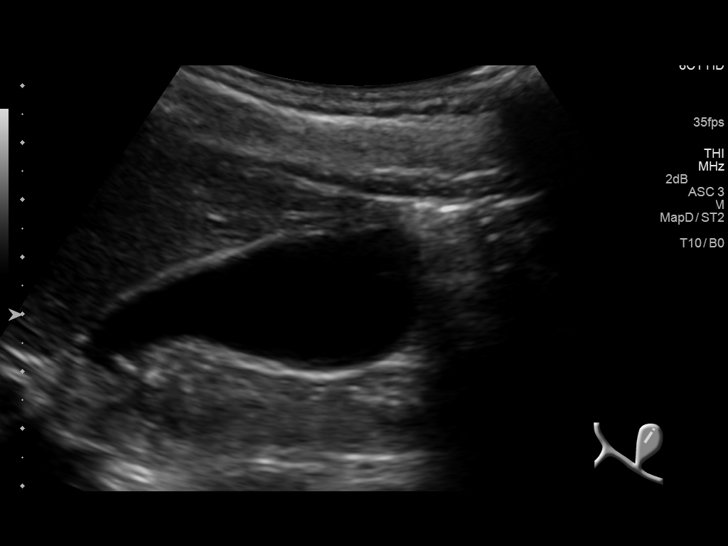
[im 7/38]
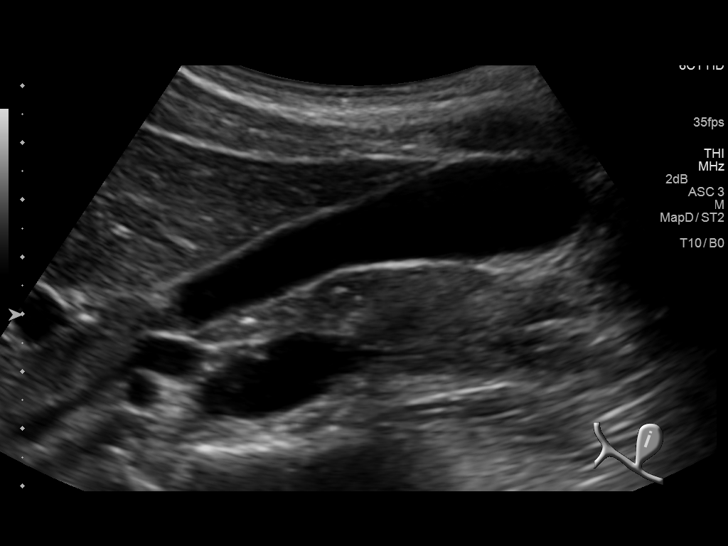
[im 10/38]
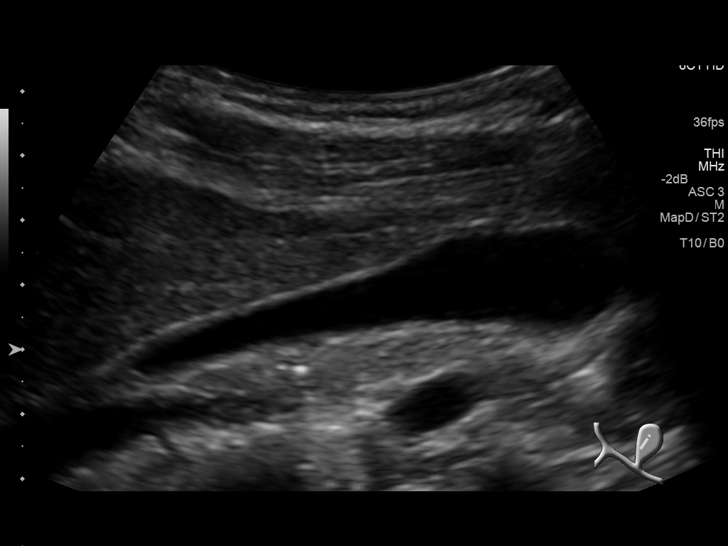
[im 13/38]
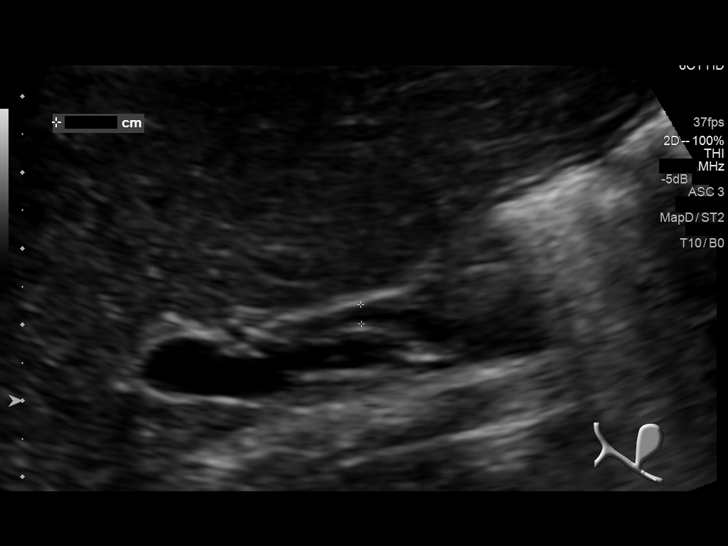
[im 14/38]
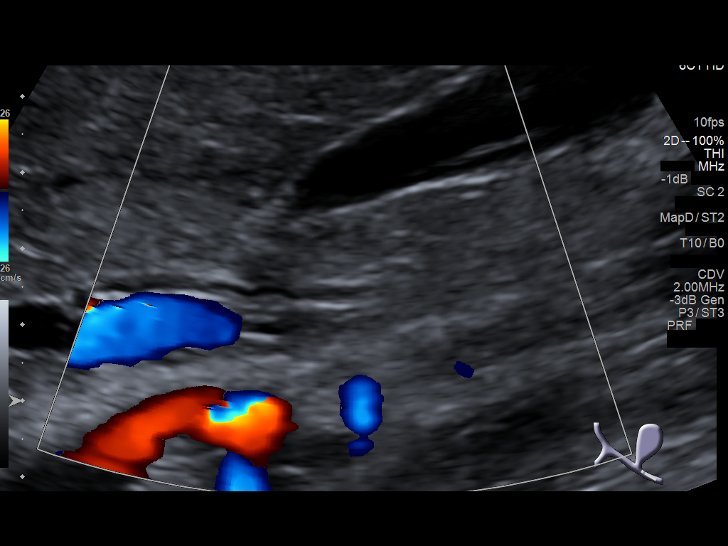
[im 17/38]
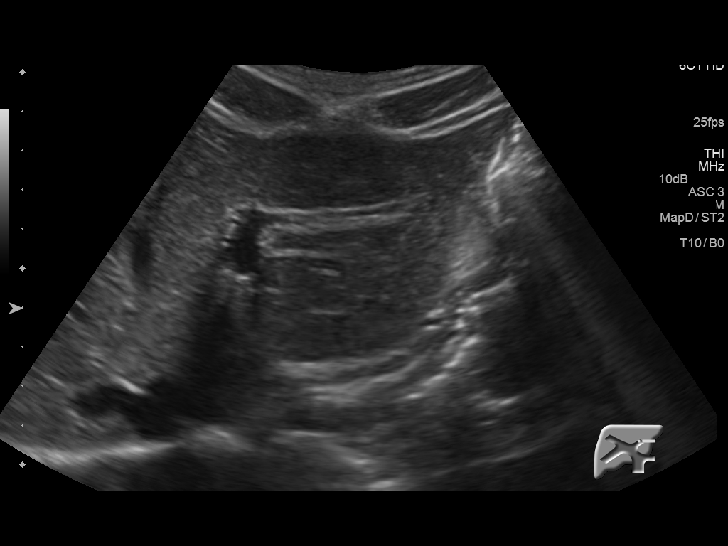
[im 21/38]
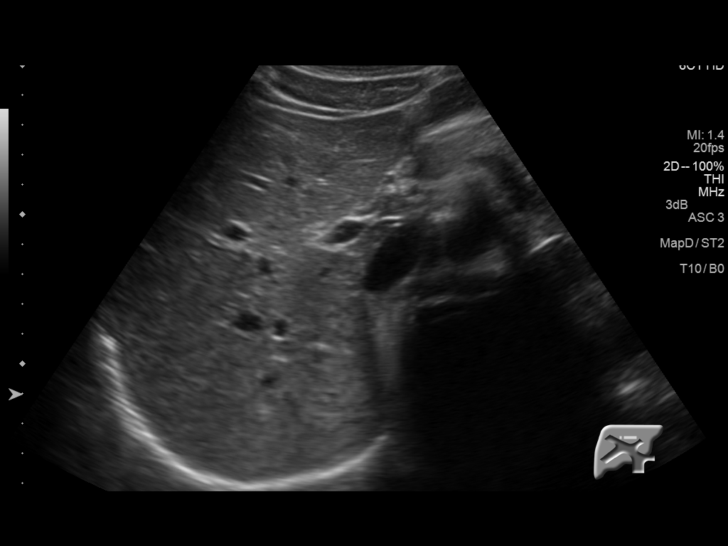
[im 24/38]
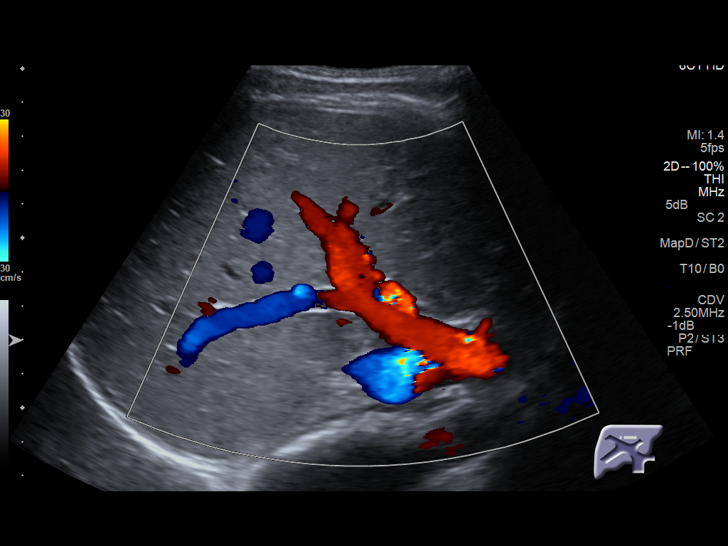
[im 25/38]
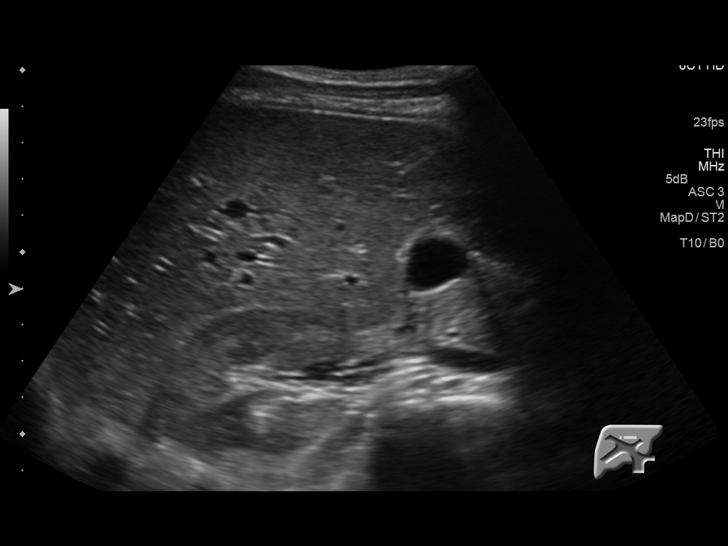
[im 28/38]
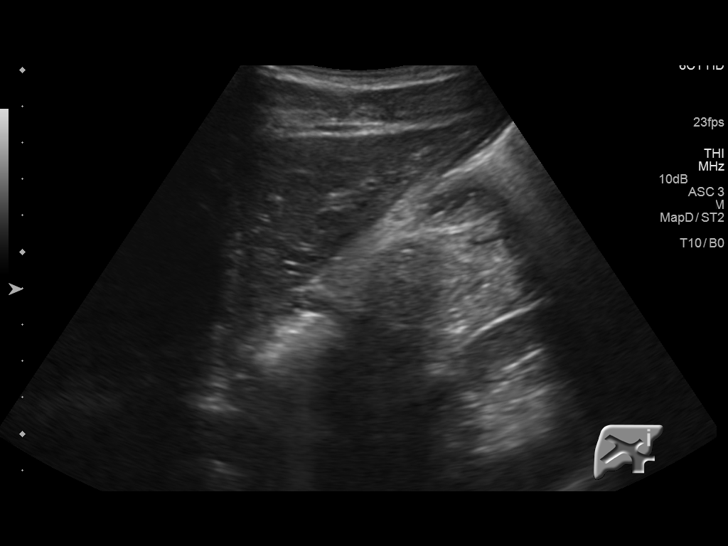
[im 31/38]
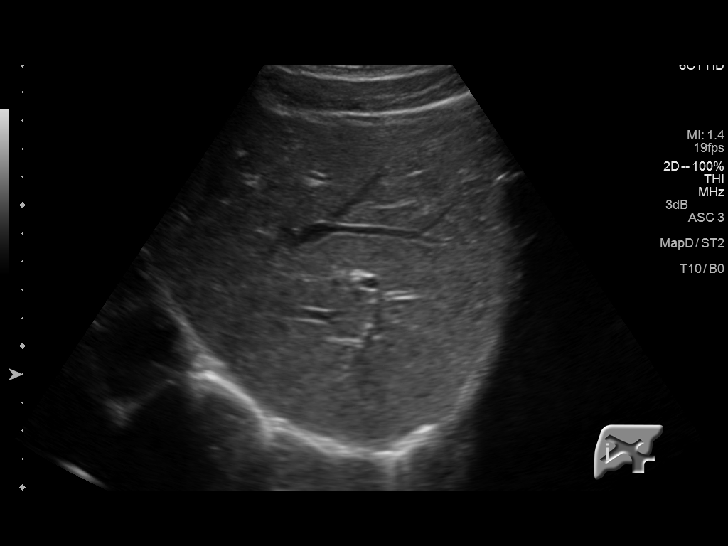
[im 34/38]
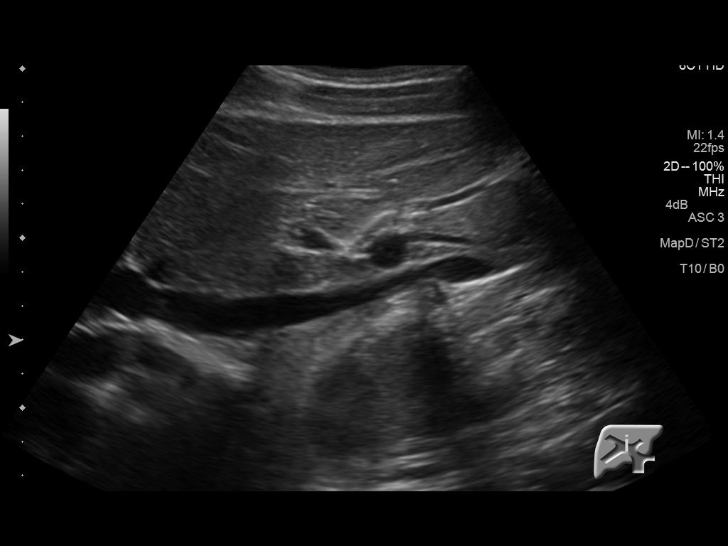
[im 38/38]
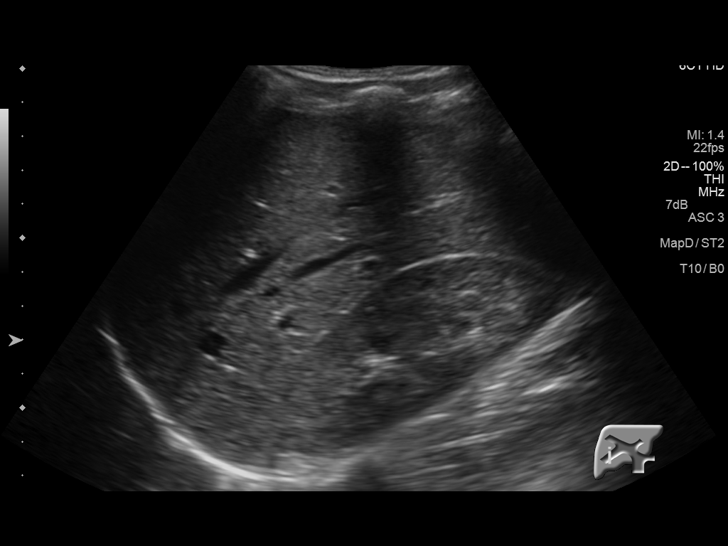

[14 of 25 positions shown; findings below may reference images not displayed]

FINDINGS: Gallbladder:

No gallstones or wall thickening visualized. No sonographic Murphy
sign noted.

Common bile duct:

Diameter: 2.1 mm

Liver:

The liver exhibits normal echotexture with no focal mass nor ductal
dilation.
IMPRESSION: Normal limited right upper quadrant ultrasound examination.

## 2015-11-21 NOTE — Telephone Encounter (Signed)
Printed what we have on file, no old records in the chart, left a VM to pick up at front desk, thanks

## 2015-12-05 DIAGNOSIS — F902 Attention-deficit hyperactivity disorder, combined type: Secondary | ICD-10-CM | POA: Diagnosis not present

## 2016-02-06 DIAGNOSIS — F902 Attention-deficit hyperactivity disorder, combined type: Secondary | ICD-10-CM | POA: Diagnosis not present

## 2016-02-06 DIAGNOSIS — F4323 Adjustment disorder with mixed anxiety and depressed mood: Secondary | ICD-10-CM | POA: Diagnosis not present

## 2016-04-13 DIAGNOSIS — F902 Attention-deficit hyperactivity disorder, combined type: Secondary | ICD-10-CM | POA: Diagnosis not present

## 2016-04-13 DIAGNOSIS — F39 Unspecified mood [affective] disorder: Secondary | ICD-10-CM | POA: Diagnosis not present

## 2016-08-23 IMAGING — NM NM HEPATO W/GB/PHARM/[PERSON_NAME]
3 series · 14 of 14 positions shown · non-contrast
Comparison: Right upper quadrant abdominal ultrasound November 27, 2013

CLINICAL DATA: Intermittent right upper quadrant abdominal pain
with nausea and vomiting over the past year, no previous abdominal
surgery

EXAM:
NUCLEAR MEDICINE HEPATOBILIARY IMAGING WITH GALLBLADDER EF
TECHNIQUE: Sequential images of the abdomen were obtained [DATE] minutes
following intravenous administration of radiopharmaceutical. After
slow intravenous infusion of 1.10 micrograms Cholecystokinin,
gallbladder ejection fraction was determined.
RADIOPHARMACEUTICALS:  5.456 mCi Zc-DDm Choletec IV

[Series 1000: hepatobiliary scan · 9.59mm/px · 6 of 60 frames shown]
[frame 6/60]
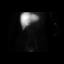
[frame 16/60]
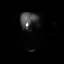
[frame 26/60]
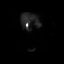
[frame 36/60]
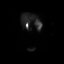
[frame 46/60]
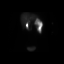
[frame 56/60]
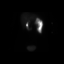

[Series 1000: hepatobiliary statics · 2.40mm/px · 2 of 2 frames shown]
[frame 1/2]
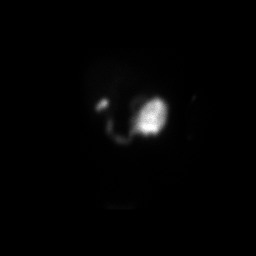
[frame 2/2]
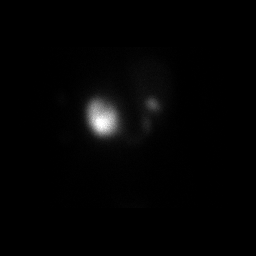

[Series 1000: gallbladder ef · 4.80mm/px · 6 of 120 frames shown]
[frame 11/120]
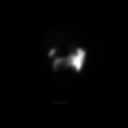
[frame 31/120]
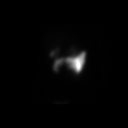
[frame 51/120]
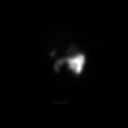
[frame 71/120]
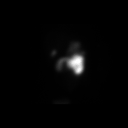
[frame 91/120]
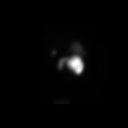
[frame 111/120]
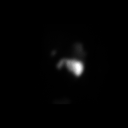

[14 of 14 positions shown; findings below may reference images not displayed]

FINDINGS: There is adequate uptake of the radiopharmaceutical by the liver.
The common bile duct and gallbladder are visible by between 10 and
15 minutes with bowel activity evident by 20 minutes. The 45 minute
gallbladder ejection fraction is 69%. The patient experienced no
symptoms during CCK infusion.. At 45 min, normal ejection fraction
is greater than 40%.
IMPRESSION: Normal hepatobiliary scan with normal gallbladder ejection fraction.

## 2016-09-01 DIAGNOSIS — F902 Attention-deficit hyperactivity disorder, combined type: Secondary | ICD-10-CM | POA: Diagnosis not present

## 2016-09-01 DIAGNOSIS — F603 Borderline personality disorder: Secondary | ICD-10-CM | POA: Diagnosis not present

## 2016-09-01 DIAGNOSIS — F39 Unspecified mood [affective] disorder: Secondary | ICD-10-CM | POA: Diagnosis not present

## 2016-09-17 DIAGNOSIS — F39 Unspecified mood [affective] disorder: Secondary | ICD-10-CM | POA: Diagnosis not present

## 2016-10-13 DIAGNOSIS — F603 Borderline personality disorder: Secondary | ICD-10-CM | POA: Diagnosis not present

## 2016-10-13 DIAGNOSIS — F902 Attention-deficit hyperactivity disorder, combined type: Secondary | ICD-10-CM | POA: Diagnosis not present

## 2016-10-13 DIAGNOSIS — F39 Unspecified mood [affective] disorder: Secondary | ICD-10-CM | POA: Diagnosis not present

## 2016-11-11 DIAGNOSIS — F603 Borderline personality disorder: Secondary | ICD-10-CM | POA: Diagnosis not present

## 2016-11-11 DIAGNOSIS — F39 Unspecified mood [affective] disorder: Secondary | ICD-10-CM | POA: Diagnosis not present

## 2016-11-11 DIAGNOSIS — F902 Attention-deficit hyperactivity disorder, combined type: Secondary | ICD-10-CM | POA: Diagnosis not present

## 2017-06-27 DIAGNOSIS — F902 Attention-deficit hyperactivity disorder, combined type: Secondary | ICD-10-CM | POA: Diagnosis not present

## 2017-06-27 DIAGNOSIS — F39 Unspecified mood [affective] disorder: Secondary | ICD-10-CM | POA: Diagnosis not present

## 2017-06-27 DIAGNOSIS — F603 Borderline personality disorder: Secondary | ICD-10-CM | POA: Diagnosis not present

## 2017-07-11 DIAGNOSIS — R6889 Other general symptoms and signs: Secondary | ICD-10-CM | POA: Diagnosis not present

## 2017-07-11 DIAGNOSIS — R51 Headache: Secondary | ICD-10-CM | POA: Diagnosis not present

## 2017-07-11 DIAGNOSIS — J029 Acute pharyngitis, unspecified: Secondary | ICD-10-CM | POA: Diagnosis not present

## 2017-11-15 DIAGNOSIS — F603 Borderline personality disorder: Secondary | ICD-10-CM | POA: Diagnosis not present

## 2017-11-15 DIAGNOSIS — F39 Unspecified mood [affective] disorder: Secondary | ICD-10-CM | POA: Diagnosis not present

## 2017-11-15 DIAGNOSIS — F902 Attention-deficit hyperactivity disorder, combined type: Secondary | ICD-10-CM | POA: Diagnosis not present
